# Patient Record
Sex: Male | Born: 1995
Health system: Southern US, Community
[De-identification: ages and names within clinical notes are randomized; demographics above are authoritative.]

---
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
## 2016-07-03 DIAGNOSIS — R197 Diarrhea, unspecified: Secondary | ICD-10-CM | POA: Diagnosis not present

## 2016-07-03 DIAGNOSIS — A084 Viral intestinal infection, unspecified: Secondary | ICD-10-CM | POA: Diagnosis not present

## 2019-05-18 LAB — COMPREHENSIVE METABOLIC PANEL
ALT: 15 U/L (ref 0–41)
AST: 12 U/L (ref 0–40)
Albumin/Globulin Ratio: 1.96 mmol/L (ref 1.00–2.00)
Albumin: 4.9 g/dL (ref 3.5–5.2)
Alk Phosphatase: 86 U/L (ref 40–130)
Anion Gap: 11 mmol/L (ref 2–17)
BUN: 12 mg/dL (ref 6–20)
CALCIUM,CORRECTED,CCA: 8.6 mg/dL (ref 8.6–10.0)
CO2: 29 mmol/L (ref 22–29)
Calcium: 9.3 mg/dL (ref 8.6–10.0)
Chloride: 100 mmol/L (ref 98–107)
Creatinine: 1 mg/dL (ref 0.7–1.3)
GFR African American: 123 mL/min/{1.73_m2} (ref >=90)
GFR Non-African American: 106 mL/min/{1.73_m2} (ref >=90)
Globulin: 2.5 g/dL (ref 1.9–4.4)
Glucose: 99 mg/dL (ref 70–99)
Osmolaliy Calculated: 279 mosm/kg (ref 270–287)
Potassium: 4.3 mmol/L (ref 3.5–5.3)
Sodium: 140 mmol/L (ref 135–145)
Total Bilirubin: 0.8 mg/dL (ref 0.00–1.20)
Total Protein: 7.4 g/dL (ref 6.4–8.3)

## 2019-05-18 LAB — CBC WITH AUTO DIFFERENTIAL
Basophils %: 0.2 % (ref 0.0–2.0)
Basophils Absolute: 0 10*3/uL (ref 0.0–0.2)
Eosinophils %: 0.7 % (ref 0.0–7.0)
Eosinophils Absolute: 0.1 10*3/uL (ref 0.0–0.5)
Hematocrit: 45.2 % (ref 38.0–52.0)
Hemoglobin: 15.3 g/dL (ref 13.0–17.3)
Immature Grans (Abs): 0.05 10*3/uL (ref 0.00–0.06)
Immature Granulocytes %: 0.4 % (ref 0.1–0.6)
Lymphocytes Absolute: 2.7 10*3/uL (ref 1.0–3.2)
Lymphocytes: 22.8 % (ref 15.0–45.0)
MCH: 31.7 pg (ref 27.0–34.5)
MCHC: 33.8 g/dL (ref 32.0–36.0)
MCV: 93.6 fL (ref 84.0–100.0)
MPV: 9.7 fL (ref 7.2–13.2)
Monocytes %: 9.7 % (ref 4.0–12.0)
Monocytes Absolute: 1.2 10*3/uL — ABNORMAL HIGH (ref 0.3–1.0)
NRBC Absolute: 0 10*3/uL (ref 0.000–0.012)
NRBC Automated: 0 % (ref 0.0–0.2)
Neutrophils %: 66.2 % (ref 42.0–74.0)
Neutrophils Absolute: 8 10*3/uL — ABNORMAL HIGH (ref 1.6–7.3)
Platelets: 309 10*3/uL (ref 140–440)
RBC: 4.83 x10e6/mcL (ref 4.00–5.60)
RDW: 12 % (ref 11.0–16.0)
WBC: 12 10*3/uL — ABNORMAL HIGH (ref 3.8–10.6)

## 2019-05-18 LAB — COVID-19, SURVEILLANCE (ASYMPTOMATIC/NO EXPOSURE, OR TEST OF CURE)
Lot/Kit Number: 706290
SARS Cov2 Ag FIA: POSITIVE — AB

## 2019-05-18 LAB — LACTIC ACID
Lactic Acid: 0.9 mmol/L (ref 0.5–2.0)
Lactic Acid: 1.2 mmol/L (ref 0.5–2.0)

## 2019-05-18 NOTE — Case Communication (Signed)
 CM D/C Planning Assessment Ongoing- Text       CM Admission Assessment Entered On:  05/18/2019 18:16 EDT    Performed On:  05/18/2019 18:12 EDT by JACKLINE OLDS               CM Admission Assessment   CM Reason for Care Management Referral :   Discharge planning assessment   CM Insurance Information :   Private Insurance   CM Insurance Co. Name :       [employer] [place] of [place]   CM Prescription Coverage :   Commercial   CM Patient has PCP :   No   Discharge Transporation Needs :   No   CM Assessment Discussion With :   Patient   CM Home/Lay Caregiver Name/Relationship :   Norleen- Father  # [phone]   CM Living Situation :   Home with no services, Family support   CM Patient Admitted From :     Pt is currently a Consulting civil engineer at the [employer]   CM Initial Tentative Discharge Plan :     D/C back to [facility]   CM Alternate Discharge Plan :     TBD   Anticipated Hosp Related Barriers to DC :   None identified   CM Home Barriers :   None   CM Progress Note :   05/18/2019  Pt is + ve for COVID.  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  He is independant with ADL's and Mobility. He states he doesn't have a PCP as he  is never ill.  Pt lives in [place] when not at school.  Dr Lenord is admitting this patient to  [address] [facility] in an OBSERVATION Status for an Acute appendicitis.  Ione Sack LMSW     CM Admission Assessment Complete :   Yes   SACK,  IONE - 05/18/2019 18:12 EDT

## 2019-05-18 NOTE — ED Notes (Signed)
ED Patient Education Note     Patient Education Materials Follows:

## 2019-05-18 NOTE — Nursing Note (Signed)
 Adult Patient History Form-Text       Adult Patient History Entered On:  05/18/2019 22:01 EDT    Performed On:  05/18/2019 21:52 EDT by Annice, RN, Mark               General Info   Patient Identified :   Identification band, Verbal   Information Given By :   Self   Pregnancy Status :   N/A   Lazzara, RN, Mark - 05/18/2019 21:52 EDT   Allergies   (As Of: 05/18/2019 22:01:29 EDT)   Allergies (Active)   No Known Medication Allergies  Estimated Onset Date:   Unspecified ; Created ByBETHA Baseman, RN, Hillary K; Reaction Status:   Active ; Category:   Drug ; Substance:   No Known Medication Allergies ; Type:   Allergy ; Updated By:   Baseman RN, Rolland POUR; Source:   Patient ; Reviewed Date:   05/18/2019 21:53 EDT        Medication History   Medication List   (As Of: 05/18/2019 22:01:29 EDT)   No Known Home Medications     KIRCHOFF,  DANIEL D-MD - 05/18/2019 19:40:49      Normal Order    Lactated Ringers Injection solution 1,000 mL  :   Lactated Ringers Injection solution 1,000 mL ; Status:   Ordered ; Ordered As Mnemonic:   Lactated Ringers Injection 1,000 mL ; Simple Display Line:   50 mL/hr, IV ; Ordering Provider:   KIRCHOFF,  DANIEL D-MD; Catalog Code:   Lactated Ringers Injection ; Order Dt/Tm:   05/18/2019 21:16:00 EDT ; Comment:   stop IV fluids once tolerating PO          oxyCODONE 5 mg Tab  :   oxyCODONE 5 mg Tab ; Status:   Ordered ; Ordered As Mnemonic:   oxyCODONE ; Simple Display Line:   5 mg, 1 tabs, Oral, q6hr, PRN: moderate pain (4-7) ; Ordering Provider:   DICKSON SIEVING D-MD; Catalog Code:   oxyCODONE ; Order Dt/Tm:   05/18/2019 21:16:43 EDT ; Comment:    IMMEDIATE RELEASE            acetaminophen 325 mg Tab  :   acetaminophen 325 mg Tab ; Status:   Ordered ; Ordered As Mnemonic:   Tylenol ; Simple Display Line:   650 mg, 2 tabs, Oral, q4hr, PRN: mild pain (1-3) ; Ordering Provider:   DICKSON SIEVING D-MD; Catalog Code:   acetaminophen ; Order Dt/Tm:   05/18/2019 21:16:00 EDT ; Comment:   MAX DAILY DOSE OF  ACETAMINOPHEN = 3000 MG          HYDROmorphone 2 mg/mL Inj Soln 1 mL  :   HYDROmorphone 2 mg/mL Inj Soln 1 mL ; Status:   Ordered ; Ordered As Mnemonic:   Dilaudid range dose ; Simple Display Line:   1 mg, 0.5 mL, IV Push, q3hr, PRN: severe pain (8-10) ; Ordering Provider:   DICKSON SIEVING D-MD; Catalog Code:   HYDROmorphone ; Order Dt/Tm:   05/18/2019 21:16:00 EDT          ketorolac 30 mg/mL Inj Soln 1 mL  :   ketorolac 30 mg/mL Inj Soln 1 mL ; Status:   Ordered ; Ordered As Mnemonic:   Toradol ; Simple Display Line:   30 mg, 1 mL, IV Push, q8hr, PRN: moderate pain (4-7) ; Ordering Provider:   DICKSON SIEVING D-MD; Catalog Code:   ketorolac ;  Order Dt/Tm:   05/18/2019 21:16:00 EDT          ondansetron 2 mg/mL Inj Soln 2 mL  :   ondansetron 2 mg/mL Inj Soln 2 mL ; Status:   Ordered ; Ordered As Mnemonic:   ondansetron ; Simple Display Line:   4 mg, 2 mL, IV Push, q4hr, PRN: nausea/vomiting ; Ordering Provider:   DICKSON SIEVING D-MD; Catalog Code:   ondansetron ; Order Dt/Tm:   05/18/2019 21:16:00 EDT ; Comment:   Use Zofran first if both Zofran and Phenergan ordered          oxyCODONE 5 mg Tab  :   oxyCODONE 5 mg Tab ; Status:   Ordered ; Ordered As Mnemonic:   oxyCODONE ; Simple Display Line:   10 mg, 2 tabs, Oral, q3hr, PRN: severe pain (8-10) ; Ordering Provider:   DICKSON SIEVING D-MD; Catalog Code:   oxyCODONE ; Order Dt/Tm:   05/18/2019 21:16:00 EDT ; Comment:    IMMEDIATE RELEASE            piperacillin-tazo 4.5 g add-vantage + Sodium Chloride 0.9% intravenous solution add-vantage 100 mL...  :   piperacillin-tazo 4.5 g add-vantage + Sodium Chloride 0.9% intravenous solution add-vantage 100 mL... ; Status:   Completed ; Ordered As Mnemonic:   piperacillin-tazobactam + Sodium Chloride 0.9% 100 mL ; Simple Display Line:   4.5 g, 25 mL/hr, IV Piggyback, Once ; Ordering Provider:   KIRCHOFF,  DANIEL D-MD; Catalog Code:   piperacillin-tazobactam ; Order Dt/Tm:   05/18/2019 18:18:40 EDT           piperacillin-tazo 4.5 g/100 ml NS ADM  :   piperacillin-tazo 4.5 g/100 ml NS ADM ; Status:   Completed ; Ordered As Mnemonic:   piperacillin-tazobactam ( Zosyn ) ; Simple Display Line:   4.5 g, 100 mL, 200 mL/hr, IV Piggyback, Once ; Ordering Provider:   MAILE,  CAMERON JAMES-MD; Catalog Code:   piperacillin-tazobactam ; Order Dt/Tm:   05/18/2019 17:52:36 EDT          Sodium Chloride 0.9% intravenous solution Bolus  :   Sodium Chloride 0.9% intravenous solution Bolus ; Status:   Completed ; Ordered As Mnemonic:   Sodium Chloride 0.9% bolus ; Simple Display Line:   2,640 mL, 440 mL/hr, IV, Once ; Ordering Provider:   MAILE,  CAMERON JAMES-MD; Catalog Code:   Sodium Chloride 0.9% ; Order Dt/Tm:   05/18/2019 17:52:37 EDT ; Comment:   Target Dose: Sodium Chloride 0.9% bolus 30 mL/kg  05/18/2019 17:52:38          morphine 4 mg/mL preservative-free Sol 1 mL  :   morphine 4 mg/mL preservative-free Sol 1 mL ; Status:   Completed ; Ordered As Mnemonic:   morphine ; Simple Display Line:   4 mg, 1 mL, IV Push, Once ; Ordering Provider:   LEORA LEGACY M-DO; Catalog Code:   morphine ; Order Dt/Tm:   05/18/2019 15:40:51 EDT          ondansetron 2 mg/mL Inj Soln 2 mL  :   ondansetron 2 mg/mL Inj Soln 2 mL ; Status:   Completed ; Ordered As Mnemonic:   Zofran ; Simple Display Line:   4 mg, 2 mL, IV Push, Once ; Ordering Provider:   LEORA LEGACY M-DO; Catalog Code:   ondansetron ; Order Dt/Tm:   05/18/2019 15:40:50 EDT          Sodium Chloride 0.9% intravenous solution Bolus  :  Sodium Chloride 0.9% intravenous solution Bolus ; Status:   Completed ; Ordered As Mnemonic:   Sodium Chloride 0.9% bolus ; Simple Display Line:   1,000 mL, 2000 mL/hr, IV Piggyback, Once ; Ordering Provider:   LEORA LEGACY M-DO; Catalog Code:   Sodium Chloride 0.9% ; Order Dt/Tm:   05/18/2019 15:40:51 EDT            Prescription/Discharge Order    oxyCODONE  :   oxyCODONE ; Status:   Prescribed ; Ordered As Mnemonic:   oxyCODONE 5 mg oral tablet ;  Simple Display Line:   5 mg, 1 tabs, Oral, q6hr, PRN: severe pain (8-10), 7 tabs, 0 Refill(s) ; Ordering Provider:   DICKSON SIEVING D-MD; Catalog Code:   oxyCODONE ; Order Dt/Tm:   05/18/2019 19:41:06 EDT            Problem History   (As Of: 05/18/2019 22:01:29 EDT)   Diagnoses(Active)    Acute appendicitis  Date:   05/18/2019 ; Diagnosis Type:   Discharge ; Confirmation:   Confirmed ; Clinical Dx:   Acute appendicitis ; Classification:   Medical ; Clinical Service:   Non-Specified ; Code:   ICD-10-CM ; Probability:   0 ; Diagnosis Code:   K35.80      COVID-19  Date:   05/18/2019 ; Diagnosis Type:   Discharge ; Confirmation:   Confirmed ; Clinical Dx:   COVID-19 ; Classification:   Medical ; Clinical Service:   Non-Specified ; Code:   ICD-10-CM ; Probability:   0 ; Diagnosis Code:   U07.1        Procedure History        -    Procedure History   (As Of: 05/18/2019 22:01:29 EDT)     Procedure Dt/Tm:   05/18/2019 19:03:00 EDT ; Location:   [facility] OR ; Provider:   DICKSON SIEVING D-MD; Anesthesia Type:   General ; :   CLORIA LYNWOOD GRANDCHILD; Anesthesia Minutes:   0 ; Procedure Name:   Appendectomy Laparoscopic ; Procedure Minutes:   31 ; Comments:     05/18/2019 19:41 EDT - MIMS, RN, BARNIE ADE  auto-populated from documented surgical case ; Clinical Service:   Surgery ; Last Reviewed Dt/Tm:   05/18/2019 21:59:01 EDT            Immunizations   Last Tetanus :   Unknown   COVID-19 Vaccine Status :   1 Dose received   1 Dose Received Manufacturer :   Paulino ([employer]) vaccine   Lazzara, RN, Mark - 05/18/2019 21:52 EDT   ID Risk Screen Symptoms   Last 14 days COVID-19 ID :   Yes - Detected (positive)   Lazzara, RNOneil - 05/18/2019 21:52 EDT   Bloodless Medicine   Is Blood Transfusion Acceptable to Patient :   Yes   Lazzara, RN, Mark - 05/18/2019 21:52 EDT   Nutrition   MST Does Your Current Diet Include :   None   MST Have You Recently Lost Weight Without Trying? :   No   MST Weight Loss Score :   0    MST Have You  Been Eating Poorly? :   No   MST Appetite Score :   0    MST Score :   0    MST Interpretation :   Not at risk   Annice OBIE Oneil - 05/18/2019 21:52 EDT   Functional   Sensory Deficits :  None   ADLs Prior to Admission :   Independent   Lazzara, RN, Mark - 05/18/2019 21:52 EDT   Social History   Social History   (As Of: 05/18/2019 22:01:29 EDT)     Harm Screen   Feels Unsafe at Home :   No   Last 3 mo, thoughts killing self/others :   Patient denies   Lazzara, RN, Mark - 05/18/2019 21:52 EDT   Advance Directive   Advance Directive :   No   Lazzara, RN, Oneil - 05/18/2019 21:52 EDT   Education   Caregiver/Advocate Language   Patient :   Verbal explanation, Demonstration   Family :   Verbal explanation   Lazzara, RN, Oneil - 05/18/2019 21:52 EDT   Barriers to Learning :   None evident   Teaching Method :   Demonstration, Explanation   Responsible Learner Present for Session :   Yes   Lazzara, RN, Mark - 05/18/2019 21:52 EDT   Preventative Measures Information   Unit/Room Orientation :   Verbalizes understanding   Environmental Safety :   Verbalizes understanding   Hand Washing :   Verbalizes understanding   Infection Prevention :   Verbalizes understanding   DVT Prophylaxis :   Verbalizes understanding   Isolation Precaution :   Verbalizes understanding   Lazzara, RN, Oneil - 05/18/2019 21:52 EDT   DC Needs   CM Living Situation :   Home with no services, Family support   Anticipated Discharge Needs :   None   Annice OBIE Oneil - 05/18/2019 21:52 EDT   Valuables and Belongings   Does Patient Have Valuables and Belongings :   Yes   Lazzara, RN, Mark - 05/18/2019 21:52 EDT   Valuables and Belongings   At Bedside :   Cell phone   Annice, RN, Mark - 05/18/2019 21:52 EDT   Admission Complete   Admission Complete :   Yes   Lazzara, RN, Mark - 05/18/2019 21:52 EDT

## 2019-05-18 NOTE — ED Notes (Signed)
ED Triage Note       ED Secondary Triage Entered On:  05/18/2019 15:58 EDT    Performed On:  05/18/2019 15:57 EDT by Anastasia Fiedler, RN, Irving Burton               General Information   Barriers to Learning :   None evident   ED Home Meds Section :   Document assessment   [facility] ED Fall Risk Section :   Document assessment   ED Advance Directives Section :   Document assessment   ED Palliative Screen :   N/A (prefilled for <23yo)   Anastasia Fiedler RNIrving Burton - 05/18/2019 15:57 EDT   (As Of: 05/18/2019 15:58:18 EDT)   Diagnoses(Active)    Abdominal pain  Date:   05/18/2019 ; Diagnosis Type:   Reason For Visit ; Confirmation:   Complaint of ; Clinical Dx:   Abdominal pain ; Classification:   Medical ; Clinical Service:   Emergency medicine ; Code:   PNED ; Probability:   0 ; Diagnosis Code:   [id number]      COVID-19  Date:   05/18/2019 ; Diagnosis Type:   Discharge ; Confirmation:   Confirmed ; Clinical Dx:   COVID-19 ; Classification:   Medical ; Clinical Service:   Non-Specified ; Code:   ICD-10-CM ; Probability:   0 ; Diagnosis Code:   U07.1      Right lower quadrant pain  Date:   05/18/2019 ; Diagnosis Type:   Discharge ; Confirmation:   Confirmed ; Clinical Dx:   Right lower quadrant pain ; Classification:   Medical ; Clinical Service:   Non-Specified ; Code:   ICD-10-CM ; Probability:   0 ; Diagnosis Code:   R10.31             -    Procedure History   (As Of: 05/18/2019 15:58:18 EDT)     Phoe[facility] Perch Fall Risk Assessment Tool   Hx of falling last 3 months ED Fall :   No   Patient confused or disoriented ED Fall :   No   Patient intoxicated or sedated ED Fall :   No   Patient impaired gait ED Fall :   No   Use a mobility assistance device ED Fall :   No   Patient altered elimination ED Fall :   No   [facility] ED Fall Score :   0    Helms, RNIrving Burton - 05/18/2019 15:57 EDT   ED Advance Directive   Advance Directive :   No   Anastasia Fiedler, RN, Irving Burton - 05/18/2019 15:57 EDT   Med Hx   Medication List   (As Of: 05/18/2019 15:58:18 EDT)    Normal Order    morphine 4 mg/mL preservative-free Sol 1 mL  :   morphine 4 mg/mL preservative-free Sol 1 mL ; Status:   Ordered ; Ordered As Mnemonic:   morphine ; Simple Display Line:   4 mg, 1 mL, IV Push, Once ; Ordering Provider:   Lollie Sails M-DO; Catalog Code:   morphine ; Order Dt/Tm:   05/18/2019 15:40:51 EDT          ondansetron 2 mg/mL Inj Soln 2 mL  :   ondansetron 2 mg/mL Inj Soln 2 mL ; Status:   Ordered ; Ordered As Mnemonic:   Zofran ; Simple Display Line:   4 mg, 2 mL, IV Push, Once ; Ordering Provider:   Lollie Sails M-DO; Catalog Code:   ondansetron ;  Order Dt/Tm:   05/18/2019 15:40:50 EDT          Sodium Chloride 0.9% intravenous solution Bolus  :   Sodium Chloride 0.9% intravenous solution Bolus ; Status:   Ordered ; Ordered As Mnemonic:   Sodium Chloride 0.9% bolus ; Simple Display Line:   1,000 mL, 2000 mL/hr, IV Piggyback, Once ; Ordering Provider:   Lollie Sails M-DO; Catalog Code:   Sodium Chloride 0.9% ; Order Dt/Tm:   05/18/2019 15:40:51 EDT

## 2019-05-18 NOTE — Op Note (Signed)
Phase II Record - [facility]             Phase II Record - [facility] Summary                                                                  Primary Physician:        Ramond Dial D-MD    Case Number:              [id number]    Finalized Date/Time:      05/18/19 20:44:43    Pt. Name:                 Drew Lowe, Drew Lowe    D.O.B./Sex:               07-19-1995    Male    Med Rec #:                [id number]    Physician:                Ramond Dial D-MD    Financial #:              [id number]    Pt. Type:                 O    Room/Bed:                 /    Admit/Disch:              05/18/19 13:59:00 -    Institution:       [facility] Case Attendance - Phase II                                                                                           Entry 1                         Entry 2                                                                          Case Attendee             KIRCHOFF,  DANIEL D-MD          Vinnie Level, RN, RACHEL V    Role Performed            Surgeon Primary                 Post Anesthesia Care  Nurse    Time In     Time Out     Last Modified By:         Vinnie Level RN, Margo Aye, RN, RACHEL                              V 05/18/19 20:16:55             V 05/18/19 20:30:53      [facility] Case Attendance - Phase II Audit                                                            05/18/19 20:30:53         Owner: Junie Bame                               Modifier: [email or web address]                                                        <+> 2         Case Attendee        <+> 2         Role Performed        [facility] - Case Times - Phase II                                                                                              Entry 1                                                                                                          Phase II In               05/18/19 20:30:00               Phase II Out                    05/18/19 20:35:00    Phase  II Discharge        05/18/19 20:35:00    Time     Last Modified By:         Tanna Furry RN, Alma Friendly  05/18/19 20:44:41              Finalized By: Laurence Ferrari D      Document Signatures                                                                             Signed By:           Laurence Ferrari D 05/18/19 20:44

## 2019-05-18 NOTE — Discharge Summary (Signed)
 ED Clinical Summary                        [facility]  [address]  [place], [place] [address]  [phone]           PERSON INFORMATION  Name: Drew Lowe, Drew Lowe Age:  24 Years DOB: Apr 01, 1995   Sex: Male Language: English PCP: PCP,  NONE   Marital Status: Single Phone: [phone] Med Service: MED-Medicine   MRN: [id number] Acct# [id number] Arrival: 05/18/2019 13:59:00   Visit Reason: Abdominal pain; COVID+/RT LOWER QUAT PAIN/NAUSEA Acuity: 3 LOS: 000 04:49   Address:    [address] [place] [address]   Diagnosis:    Acute appendicitis; COVID-19; Right lower quadrant pain  Medications:    Medications Administered During Visit:                Medication Dose Route   ondansetron 4 mg IV Push   morphine 4 mg IV Push   Sodium Chloride 0.9% 1000 mL IV Piggyback   piperacillin-tazobactam 4.5 g IV Piggyback   Sodium Chloride 0.9% 2640 mL IV               Allergies      No Known Medication Allergies      Major Tests and Procedures:  The following procedures and tests were performed during your ED visit.  COMMON PROCEDURES%>  COMMON PROCEDURES COMMENTS%>                PROVIDER INFORMATION               Provider Role Assigned Sampson Litter, RN, [facility] ED Nurse 05/18/2019 15:30:34    LEORA LEGACY M-DO ED Provider 05/18/2019 15:36:05    CHERL SMALLS JAMES-MD ED Provider 05/18/2019 15:58:33        Attending Physician:  DICKSON SIEVING D-MD      Admit Doc  DICKSON SIEVING D-MD     Consulting Doc  DICKSON SIEVING D-MD     VITALS INFORMATION  Vital Sign Triage Latest   Temp Oral ORAL_1%> ORAL%>   Temp Temporal TEMPORAL_1%> TEMPORAL%>   Temp Intravascular INTRAVASCULAR_1%> INTRAVASCULAR%>   Temp Axillary AXILLARY_1%> AXILLARY%>   Temp Rectal RECTAL_1%> RECTAL%>   02 Sat 100 % 100 %   Respiratory Rate RATE_1%> RATE%>   Peripheral Pulse Rate PULSE RATE_1%>79 bpm PULSE RATE%>   Apical Heart Rate HEART RATE_1%> HEART RATE%>   Blood Pressure BLOOD PRESSURE_1%>/ BLOOD PRESSURE_1%>76 mmHg BLOOD PRESSURE%> / BLOOD  PRESSURE%>74 mmHg                 Immunizations      No Immunizations Documented This Visit          DISCHARGE INFORMATION   Discharge Disposition: S Outpt-Admitted As Inpatient   Discharge Location:  [facility]   Discharge Date and Time:     ED Checkout Date and Time:  05/18/2019 18:48:47     DEPART REASON INCOMPLETE INFORMATION             Problems      No Problems Documented              Smoking Status      No Smoking Status Documented         PATIENT EDUCATION INFORMATION  Instructions:          Follow up:            ED PROVIDER  DOCUMENTATION     Patient:   Drew Lowe, Drew Lowe             MRN: [id number]            FIN: [id number]               Age:   24 years     Sex:  Male     DOB:  [birth year]   Associated Diagnoses:   Right lower quadrant pain; COVID-19; Acute appendicitis   Author:   LEORA LEGACY M-DO      Basic Information   Time seen: Provider Seen (ST)   ED Provider/Time:    LEORA LEGACY M-DO / 05/18/2019 15:36  .   Additional information: Chief Complaint from Nursing Triage Note   Chief Complaint  Chief Complaint: pt arrived wtih c/o RLQ abd pain since yesterday. pt was sent for possible appendicitis. pt is covid positive from 4/9. (05/18/19 14:01:00).      History of Present Illness   24 year old male presented with progressively worsening 4-10 right lower quadrant abdominal pain mild pain in the left lower quadrant more on the right lower quadrant, worse with palpation, some nausea no vomiting, he last ate 3 hours ago.  Denies any dysuria urgency or frequency.  Denies any flank pain.  Denies any fevers or chills.  Incidentally 5 days ago he was diagnosed with Covid because he had a close contact he otherwise has not had any respiratory symptoms.  He was seen at his school somewhere and advised to come to the emergency department because he could have appendicitis.  Denies any testicular pain.        Review of Systems             Additional review of systems information: All other systems reviewed and  otherwise negative.      Health Status   Allergies:    Allergic Reactions (All)  No Known Medication Allergies.   Medications:  (Selected)   Inpatient Medications  Ordered  Sodium Chloride 0.9% bolus: 1,000 mL, 2000 mL/hr, IV Piggyback, Once  Zofran: 4 mg, 2 mL, IV Push, Once  morphine: 4 mg, 1 mL, IV Push, Once.      Past Medical/ Family/ Social History   Medical history: Reviewed as documented in chart.   Surgical history: Reviewed as documented in chart.   Family history: Not significant.   Social history: Reviewed as documented in chart.   Problem list:    No qualifying data available  .      Physical Examination               Vital Signs   Vital Signs   05/18/2019 15:36 EDT Systolic Blood Pressure 142 mmHg  HI     Diastolic Blood Pressure 95 mmHg  HI     Heart Rate Monitored 90 bpm     Respiratory Rate 16 br/min     SpO2 100 %    05/18/2019 14:01 EDT Systolic Blood Pressure 138 mmHg     Diastolic Blood Pressure 76 mmHg (Modified)    Temperature Oral 37.0 degC (Modified)    Heart Rate Monitored 84 bpm (Modified)    Respiratory Rate 16 br/min     SpO2 100 %    .   General:  Alert, no acute distress.    Skin:  Warm, dry.    Head:  Normocephalic.   Neck:  Supple, trachea midline, no JVD.    Eye:  Pupils are equal,  round and reactive to light, extraocular movements are intact.    Cardiovascular:  Regular rate and rhythm, No murmur.    Respiratory:  Lungs are clear to auscultation, respirations are non-labored.    Chest wall:  No deformity.   Back:  Nontender, Normal range of motion.    Musculoskeletal:  Normal ROM, normal strength.    Gastrointestinal:  Soft, Non distended, Normal bowel sounds, No pulsatile midline mass., Tenderness: Moderate, right lower quadrant, Guarding: Minimal.    Genitourinary:  GU exam is normal, performed with nurse Damien and tech in room.   Neurological:  Alert and oriented to person, place, time, and situation, No focal neurological deficit observed, normal speech observed, Gait is steady..     Lymphatics   Psychiatric:  Cooperative, appropriate mood & affect.       Medical Decision Making   Results review:  Lab results : Lab View   05/18/2019 15:47 EDT Lactic Acid Lvl 0.9 mmol/L    SARS Cov2 Ag FIA Positive   05/18/2019 14:08 EDT Estimated Creatinine Clearance 129.65 mL/min   05/18/2019 14:03 EDT Estimated Creatinine Clearance 137.74 mL/min   05/18/2019 12:51 EDT WBC 12.0 x10e3/mcL  HI    RBC 4.83 x10e6/mcL    Hgb 15.3 g/dL    HCT 54.7 %    MCV 06.3 fL    MCH 31.7 pg    MCHC 33.8 g/dL    RDW 87.9 %    Platelet 309 x10e3/mcL    MPV 9.7 fL    Neutro Auto 66.2 %    Neutro Absolute 8.0 x10e3/mcL  HI    Immature Grans Percent 0.4 %    Immature Grans Absolute 0.05 x10e3/mcL    Lymph Auto 22.8 %    Lymph Absolute 2.7 x10e3/mcL    Mono Auto 9.7 %    Mono Absolute 1.2 x10e3/mcL  HI    Eosinophil Percent 0.7 %    Eos Absolute 0.1 x10e3/mcL    Basophil Auto 0.2 %    Baso Absolute 0.0 x10e3/mcL    NRBC Absolute Auto 0.000 x10e3/mcL    NRBC Percent Auto 0.0 %    Sodium Lvl 140 mmol/L    Potassium Lvl 4.3 mmol/L    Chloride 100 mmol/L    CO2 29 mmol/L    Glucose Random 99 mg/dL    BUN 12 mg/dL    Creatinine Lvl 1.0 mg/dL    AGAP 11 mmol/L    Osmolality Calc 279 mOsm/kg    Calcium Lvl 9.3 mg/dL    Calcium (Corrected) Lvl 8.6 mg/dL    Protein Total 7.4 g/dL    Albumin Lvl 4.9 g/dL    Globulin Calc 2.5 g/dL    AG Ratio Calc 8.03 mmol/L    Alk Phos 86 unit/L    AST 12 unit/L    ALT 15 unit/L    eGFR AA 123 mL/min/1.47m    eGFR Non-AA 106 mL/min/1.73m    Bili Total 0.80 mg/dL     .   Radiology results:  Rad Results (ST)   CT Abdomen Pelvis w/o Contrast  ?  05/18/19 17:11:55  CT abdomen pelvis without contrast: 05/18/19    INDICATION: RLQ abdominal pain, appendicitis suspected    COMPARISON: None.    TECHNIQUE: Noncontrast CT was obtained from the lung bases to the pubic  symphysis. CT scanning was performed using radiation dose reduction techniques  when appropriate, per system protocols.    FINDINGS:    LOWER  THORAX:  Visualized lung bases are clear.  ABDOMEN AND PELVIS:    LIVER: No focal lesion.    BILIARY SYSTEM: No cholelithiasis or cholecystitis. No ductal dilatation.    SPLEEN: No splenomegaly.    PANCREAS: No main duct dilatation.    ADRENALS: No nodule.    URINARY SYSTEM: No stones. No hydronephrosis or hydroureter.    STOMACH/BOWEL: No bowel obstruction. No pneumoperitoneum. There is borderline  dilation of the appendix which measures 8 mm in diameter best seen on image  3-55. There is a segment of descending colon measuring approximately 9 cm in  length which is partially decompressed with mild thickening best seen on image  4-56. Radiodense material within the colon, likely ingested contents.    ABDOMINOPELVIC WALL/PERITONEUM: No ascites.    VASCULAR: Aorta is normal in caliber.    PELVIC ORGANS: Unremarkable.    LYMPH NODES: Prominent mesenteric lymph nodes within the right lower quadrant  which are not pathologically enlarged are likely reactive in nature.    BONES/SOFT TISSUES: No suspicious osseous lesions.    IMPRESSION:  Borderline dilation of the appendix which measures up to 8 mm in diameter which  can be seen in early appendicitis. No evidence of abscess formation or  perforation. Recommend general surgery consultation.    There is a 9 cm segment of descending colon which is decompressed with mild wall  thickening. This thickening may be secondary to decompression but can also be  seen in colitis.  ?  Signed By: LONZELL ROGUE C-MD    .   Notes:  Patient reexamined by me at 540.  He is indeed tender in the right lower quadrant, positive rebound.  This in conjunction with his CT is consistent with acute appendicitis.  He is also Covid positive.  I did speak with Dr. Marry at 550 who will admit this patient for further evaluation management.      Impression and Plan   Diagnosis   Acute appendicitis (ICD10-CM K35.80, Discharge, Medical)   Right lower quadrant pain (ICD10-CM R10.31, Discharge,  Medical)   COVID-19 (ICD10-CM U07.1, Discharge, Medical)   Plan   Disposition: Admit time  05/18/2019 17:54:00, Admit to Surgery, Patient care transitioned to: Time: 05/18/2019 16:00:00, MAILE,  CAMERON JAMES-MD, follow up ct, labs, dispo.

## 2019-05-18 NOTE — ED Notes (Signed)
 ED Patient Summary              [facility] Emergency Department  [address] [place], [place] [address]  [phone]  Discharge Instructions (Patient)  _______________________________________     Name: Drew Lowe, Drew Lowe  DOB:  1995/08/27                   MRN: [id number]                   FIN: [id number]  Reason For Visit: Abdominal pain; COVID+/RT LOWER QUAT PAIN/NAUSEA  Final Diagnosis: Acute appendicitis; COVID-19; Right lower quadrant pain     Visit Date: 05/18/2019 13:59:00  Address: [address] [place] [address]  Phone: [phone]     Emergency Department Providers:         Primary Physician:     MAILE, CAMERON [facility] would like to thank you for allowing us  to assist you with your healthcare needs. The following includes patient education materials and information regarding your injury/illness.     Follow-up Instructions:  You were seen today on an emergency basis. Please contact your primary care doctor for a follow up appointment. If you received a referral to a specialist doctor, it is important you follow-up as instructed.    It is important that you call your follow-up doctor to schedule and confirm the location of your next appointment. Your doctor may practice at multiple locations. The office location of your follow-up appointment may be different to the one written on your discharge instructions.    If you do not have a primary care doctor, please call [phone] for help in finding a [facility] Provider. For help in finding a specialist doctor, please call [phone].    The [facility] "Ask a Nurse" line in staffed by Registered Nurses and is a free service to the community. We are available Monday - Friday from 8am to 5pm to answer your questions about your health. Please call [phone].    If your condition gets worse before your follow-up with your primary care doctor or specialist, please return to the Emergency  Department.      Coronavirus 2019 (COVID-19) Reminders:     Patients aged 72 and older, people with increased risk for severe COVID-19 disease, or frontline workers with increased occupational risk can make an appointment for a COVID-19 vaccine. Patients can contact their [facility] doctors' offices to schedule an appointment to receive the COVID-19 vaccine at the [facility] or send us  an email at [email or web address]. Patients who do not have a [facility] physician can call [phone] to schedule vaccination appointments.            Scan this code with your phone camera to send an email to the address above.          Follow Up Appointments:  Primary Care Provider:      Name: PCP,  NONE      Phone:           Printed Prescriptions:    Patient Education Materials:  Discharge Orders       Comment:             Allergy Info: No Known Medication Allergies     Medication Information:  [facility] ED Physicians provided you with a complete list of medications post discharge, if  you have been instructed to stop taking a medication please ensure you also follow up with this information to your Primary Care Physician.  Unless otherwise noted, patient will continue to take medications as prescribed prior to the Emergency Room visit.  Any specific questions regarding your chronic medications and dosages should be discussed with your physician(s) and pharmacist.          No Medications Documented      Medications Administered During Visit:              Medication Dose Route   ondansetron 4 mg IV Push   morphine 4 mg IV Push   Sodium Chloride 0.9% 1000 mL IV Piggyback   piperacillin-tazobactam 4.5 g IV Piggyback   Sodium Chloride 0.9% 2640 mL IV          Major Tests and Procedures:  The following procedures and tests were performed during your ED visit.  COMMON PROCEDURES%>  COMMON PROCEDURES COMMENTS%>          Laboratory Orders  Name Status Details   C Blood Ordered Blood,  Stat, ST - Stat, 05/18/19 15:40:00 EDT, Once, 05/18/19 15:40:00 EDT, Nurse collect, 1 of 2   C Blood Ordered Blood, Stat, ST - Stat, 05/18/19 15:40:00 EDT, Once, 05/18/19 15:40:00 EDT, Nurse collect, 2 of 2   COV FIA Completed Nasal Swab, Stat, ST - Stat, 05/18/19 15:40:00 EDT, 05/18/19 15:40:00 EDT, Nurse collect, LEORA LEGACY M-DO, Print label Y/N   Lactic Completed Blood, Stat, ST - Stat, 05/18/19 15:40:00 EDT, 05/18/19 15:40:00 EDT, Nurse collect, LEORA LEGACY M-DO, Print label Y/N   Lactic Completed Blood, Stat, ST - Stat, 05/18/19 17:52:00 EDT, 05/18/19 17:52:00 EDT, Nurse collect, CHERL OLE FLEMING, Print label Y/N               Radiology Orders  Name Status Details   CT Abdomen Pelvis w/o Contrast Completed 05/18/19 16:22:00 EDT, STAT 1 hour or less, Reason: RLQ abdominal pain, appendicitis suspected, Transport Mode: STRETCHER, pp_set_radiology_subspecialty               Patient Care Orders  Name Status Details   Bed Request Communication Ordered 05/18/19 17:51:00 EDT Covid med surg, Appendicitis, Covid   COVID-19 Status Ordered 05/18/19 18:49:16 EDT, NOT VALID FOR pharmacy, laboratory, radiology., 05/18/19 18:49:16 EDT, COVID-19 Detected   Change attending to Ordered 05/18/19 17:51:00 EDT, DICKSON SIEVING D-MD   ED Assessment Adult Completed 05/18/19 14:03:28 EDT, 05/18/19 14:03:28 EDT   ED Secondary Triage Completed 05/18/19 14:03:28 EDT, 05/18/19 14:03:28 EDT   ED Triage Adult Completed 05/18/19 13:59:38 EDT, 05/18/19 13:59:38 EDT   Notify Provider Completed 05/18/19 14:11:53 EDT, This message can only be seen by Nursing, it is not visible to Pharmacy, Laboratory, or Radiology., 05/18/19 14:11:53 EDT   Patient Isolation Ordered 05/18/19 15:40:00 EDT, Contact and Airborne, Constant Indicator   Saline Lock Insert Completed 05/18/19 15:40:00 EDT, Once, 05/18/19 15:40:00 EDT        ---------------------------------------------------------------------------------------------------------------------  [facility]) encourages you to self-enroll in the [facility] Patient Portal.  [facility] Patient Portal will allow you to manage your personal health information securely from your own electronic device now and in the future.  To begin your Patient Portal enrollment process, please visit [email or web address] Click on "Sign up now" under [facility].  If you find that you need additional assistance on the [facility] Patient Portal or need a copy of your medical records, please call the [facility] Medical Records  Office at [phone].  Comment:

## 2019-05-18 NOTE — ED Provider Notes (Signed)
Abdominal Pain *ED        Patient:   Drew Lowe, Drew Lowe             MRN: [id number]            FIN: [id number]               Age:   24 years     Sex:  Male     DOB:  [birth year]   Associated Diagnoses:   Right lower quadrant pain; COVID-19; Acute appendicitis   Author:   Sonda Rumble M-DO      Basic Information   Time seen: Provider Seen (ST)   ED Provider/Time:    Sonda Rumble M-DO / 05/18/2019 15:36  .   Additional information: Chief Complaint from Nursing Triage Note   Chief Complaint  Chief Complaint: pt arrived wtih c/o RLQ abd pain since yesterday. pt was sent for possible appendicitis. pt is covid positive from 4/9. (05/18/19 14:01:00).      History of Present Illness   24 year old male presented with progressively worsening 4-10 right lower quadrant abdominal pain mild pain in the left lower quadrant more on the right lower quadrant, worse with palpation, some nausea no vomiting, he last ate 3 hours ago.  Denies any dysuria urgency or frequency.  Denies any flank pain.  Denies any fevers or chills.  Incidentally 5 days ago he was diagnosed with Covid because he had a close contact he otherwise has not had any respiratory symptoms.  He was seen at his school somewhere and advised to come to the emergency department because he could have appendicitis.  Denies any testicular pain.        Review of Systems             Additional review of systems information: All other systems reviewed and otherwise negative.      Health Status   Allergies:    Allergic Reactions (All)  No Known Medication Allergies.   Medications:  (Selected)   Inpatient Medications  Ordered  Sodium Chloride 0.9% bolus: 1,000 mL, 2000 mL/hr, IV Piggyback, Once  Zofran: 4 mg, 2 mL, IV Push, Once  morphine: 4 mg, 1 mL, IV Push, Once.      Past Medical/ Family/ Social History   Medical history: Reviewed as documented in chart.   Surgical history: Reviewed as documented in chart.   Family history: Not significant.   Social history: Reviewed as  documented in chart.   Problem list:    No qualifying data available  .      Physical Examination               Vital Signs   Vital Signs   3/87/5643 32:95 EDT Systolic Blood Pressure 188 mmHg  HI     Diastolic Blood Pressure 95 mmHg  HI     Heart Rate Monitored 90 bpm     Respiratory Rate 16 br/min     SpO2 100 %    05/19/6061 01:60 EDT Systolic Blood Pressure 109 mmHg     Diastolic Blood Pressure 76 mmHg (Modified)    Temperature Oral 37.0 degC (Modified)    Heart Rate Monitored 84 bpm (Modified)    Respiratory Rate 16 br/min     SpO2 100 %    .   General:  Alert, no acute distress.    Skin:  Warm, dry.    Head:  Normocephalic.   Neck:  Supple, trachea midline, no JVD.  Eye:  Pupils are equal, round and reactive to light, extraocular movements are intact.    Cardiovascular:  Regular rate and rhythm, No murmur.    Respiratory:  Lungs are clear to auscultation, respirations are non-labored.    Chest wall:  No deformity.   Back:  Nontender, Normal range of motion.    Musculoskeletal:  Normal ROM, normal strength.    Gastrointestinal:  Soft, Non distended, Normal bowel sounds, No pulsatile midline mass., Tenderness: Moderate, right lower quadrant, Guarding: Minimal.    Genitourinary:  GU exam is normal, performed with nurse Raquel Sarna and tech in room.   Neurological:  Alert and oriented to person, place, time, and situation, No focal neurological deficit observed, normal speech observed, Gait is steady..    Lymphatics   Psychiatric:  Cooperative, appropriate mood & affect.       Medical Decision Making   Results review:  Lab results : Lab View   05/18/2019 15:47 EDT Lactic Acid Lvl 0.9 mmol/L    SARS Cov2 Ag FIA Positive   05/18/2019 14:08 EDT Estimated Creatinine Clearance 129.65 mL/min   05/18/2019 14:03 EDT Estimated Creatinine Clearance 137.74 mL/min   05/18/2019 12:51 EDT WBC 12.0 x10e3/mcL  HI    RBC 4.83 x10e6/mcL    Hgb 15.3 g/dL    HCT 45.2 %    MCV 93.6 fL    MCH 31.7 pg    MCHC 33.8 g/dL    RDW 12.0 %    Platelet  309 x10e3/mcL    MPV 9.7 fL    Neutro Auto 66.2 %    Neutro Absolute 8.0 x10e3/mcL  HI    Immature Grans Percent 0.4 %    Immature Grans Absolute 0.05 x10e3/mcL    Lymph Auto 22.8 %    Lymph Absolute 2.7 x10e3/mcL    Mono Auto 9.7 %    Mono Absolute 1.2 x10e3/mcL  HI    Eosinophil Percent 0.7 %    Eos Absolute 0.1 x10e3/mcL    Basophil Auto 0.2 %    Baso Absolute 0.0 x10e3/mcL    NRBC Absolute Auto 0.000 x10e3/mcL    NRBC Percent Auto 0.0 %    Sodium Lvl 140 mmol/L    Potassium Lvl 4.3 mmol/L    Chloride 100 mmol/L    CO2 29 mmol/L    Glucose Random 99 mg/dL    BUN 12 mg/dL    Creatinine Lvl 1.0 mg/dL    AGAP 11 mmol/L    Osmolality Calc 279 mOsm/kg    Calcium Lvl 9.3 mg/dL    Calcium (Corrected) Lvl 8.6 mg/dL    Protein Total 7.4 g/dL    Albumin Lvl 4.9 g/dL    Globulin Calc 2.5 g/dL    AG Ratio Calc 1.96 mmol/L    Alk Phos 86 unit/L    AST 12 unit/L    ALT 15 unit/L    eGFR AA 123 mL/min/1.28m???    eGFR Non-AA 106 mL/min/1.769m??    Bili Total 0.80 mg/dL     .   Radiology results:  Rad Results (ST)   CT Abdomen Pelvis w/o Contrast  ?  05/18/19 17:11:55  CT abdomen pelvis without contrast: 05/18/19    INDICATION: RLQ abdominal pain, appendicitis suspected    COMPARISON: None.    TECHNIQUE: Noncontrast CT was obtained from the lung bases to the pubic  symphysis. CT scanning was performed using radiation dose reduction techniques  when appropriate, per system protocols.    FINDINGS:    LOWER THORAX:  Visualized  lung bases are clear.    ABDOMEN AND PELVIS:    LIVER: No focal lesion.    BILIARY SYSTEM: No cholelithiasis or cholecystitis. No ductal dilatation.    SPLEEN: No splenomegaly.    PANCREAS: No main duct dilatation.    ADRENALS: No nodule.    URINARY SYSTEM: No stones. No hydronephrosis or hydroureter.    STOMACH/BOWEL: No bowel obstruction. No pneumoperitoneum. There is borderline  dilation of the appendix which measures 8 mm in diameter best seen on image  3-55. There is a segment of descending colon measuring  approximately 9 cm in  length which is partially decompressed with mild thickening best seen on image  4-56. Radiodense material within the colon, likely ingested contents.    ABDOMINOPELVIC WALL/PERITONEUM: No ascites.    VASCULAR: Aorta is normal in caliber.    PELVIC ORGANS: Unremarkable.    LYMPH NODES: Prominent mesenteric lymph nodes within the right lower quadrant  which are not pathologically enlarged are likely reactive in nature.    BONES/SOFT TISSUES: No suspicious osseous lesions.    IMPRESSION:  Borderline dilation of the appendix which measures up to 8 mm in diameter which  can be seen in early appendicitis. No evidence of abscess formation or  perforation. Recommend general surgery consultation.    There is a 9 cm segment of descending colon which is decompressed with mild wall  thickening. This thickening may be secondary to decompression but can also be  seen in colitis.  ?  Signed By: Truman Hayward C-MD    .   Notes:  Patient reexamined by me at 540.  He is indeed tender in the right lower quadrant, positive rebound.  This in conjunction with his CT is consistent with acute appendicitis.  He is also Covid positive.  I did speak with Dr. Enis Gash at 550 who will admit this patient for further evaluation management.      Impression and Plan   Diagnosis   Acute appendicitis (ICD10-CM K35.80, Discharge, Medical)   Right lower quadrant pain (ICD10-CM R10.31, Discharge, Medical)   COVID-19 (ICD10-CM U07.1, Discharge, Medical)   Plan   Disposition: Admit time  05/18/2019 17:54:00, Admit to Surgery, Patient care transitioned to: Time: 05/18/2019 16:00:00, MAILE,  CAMERON JAMES-MD, follow up ct, labs, dispo.    Signature Line     Electronically Signed on 05/18/2019 03:43 PM EDT   ________________________________________________   Sonda Rumble M-DO      Electronically Signed on 05/18/2019 05:54 PM EDT   ________________________________________________   Lanae Boast JAMES-MD            Modified by:  Sonda Rumble M-DO on 05/18/2019 03:43 PM EDT      Modified by: Lanae Boast JAMES-MD on 05/18/2019 05:54 PM EDT

## 2019-05-18 NOTE — Op Note (Signed)
Phase I Record - [facility]             Phase I Record - [facility] Summary                                                                   Primary Physician:        Ramond Dial D-MD    Case Number:              [id number]    Finalized Date/Time:      05/18/19 20:30:39    Pt. Name:                 Drew Lowe, Drew Lowe    D.O.B./Sex:               11-15-1995    Male    Med Rec #:                [id number]    Physician:                Ramond Dial D-MD    Financial #:              [id number]    Pt. Type:                 O    Room/Bed:                 /    Admit/Disch:              05/18/19 13:59:00 -    Institution:       [facility] Case Attendance - Phase I                                                                                            Entry 1                                                                                                          Case Attendee             Vinnie Level, RN, RACHEL V        Role Performed                  Phase II Nurse    Last Modified By:         Vinnie Level, RN, RACHEL  V 05/18/19 20:17:04      [facility] - Case Times - Phase I                                                                                               Entry 1                                                                                                          Phase I In                05/18/19 19:40:00               Phase I Out                     05/18/19 20:30:00    Phase I Discharge         05/18/19 20:30:00    Time     Last Modified By:         Mayer Camel, RN, RACHEL                              V 05/18/19 20:30:37              Finalized By: Mayer Camel RN, RACHEL V      Document Signatures                                                                             Signed By:           Mayer Camel [place], RACHEL V 05/18/19 20:30

## 2019-05-18 NOTE — ED Notes (Signed)
ED Triage Note       ED Triage Adult Entered On:  05/18/2019 14:03 EDT    Performed On:  05/18/2019 14:01 EDT by Caryn Section, RN, Hillary K               Triage   Chief Complaint :   pt arrived wtih c/o RLQ abd pain since yesterday. pt was sent for possible appendicitis. pt is covid positive from 4/9.     Numeric Rating Pain Scale :   4     [place] Mode of Arrival :   Walking   [place], [place], [place] K - 05/18/2019 14:05 EDT     Infectious Disease Documentation :   Document assessment   Caryn Section RN, Hillary K - 05/18/2019 14:01 EDT   Temperature Oral :   37.0 degC(Converted to: 98.6 degF)      Heart Rate Monitored :   84 bpm   Caryn Section RN, [place] K - 05/18/2019 14:05 EDT     Respiratory Rate :   16 br/min   Systolic Blood Pressure :   138 mmHg   Caryn Section, RN, [place] K - 05/18/2019 14:01 EDT   Diastolic Blood Pressure :   76 mmHg   Fox, RN, Hillary K - 05/18/2019 14:05 EDT     SpO2 :   100 %   Oxygen Therapy :   Room air   Patient presentation :   None of the above   Chief Complaint or Presentation suggest infection :   No   Fox, RN, Hillary K - 05/18/2019 14:01 EDT   Weight Dosing :   88 kg(Converted to: 194 lb 0 oz)      Height :   178 cm(Converted to: 5 ft 10 in)      Body Mass Index Dosing :   28 kg/m2   Risa Grill K - 05/18/2019 14:05 EDT   Caryn Section, RN, Hillary K - 05/18/2019 14:05 EDT     DCP GENERIC CODE   Tracking Group :   ED [facility] Main Tracking Group   Tracking Acuity :   3   Huel Coventry - 05/18/2019 14:01 EDT   ED General Section :   Document assessment   Pregnancy Status :   N/A   ED Allergies Section :   Document assessment   ED Reason for Visit Section :   Document assessment   Huel Coventry - 05/18/2019 14:01 EDT   ID Risk Screen Symptoms   Last 14 days COVID-19 ID :   Yes - Detected (positive)         Caryn Section RN, Hillary K - 05/18/2019 14:11 EDT     Allergies   (As Of: 05/18/2019 14:08:38 EDT)   Allergies (Active)   No Known Medication Allergies  Estimated Onset Date:   Unspecified ; Created ByCaryn Section, RN, Hillary K; Reaction  Status:   Active ; Category:   Drug ; Substance:   No Known Medication Allergies ; Type:   Allergy ; Updated By:   Caryn Section RN, Linus Mako; Source:   Patient ; Reviewed Date:   05/18/2019 14:08 EDT        Psycho-Social   Last 3 mo, thoughts killing self/others :   Patient denies   Right click within box for Suspected Abuse policy link. :   None   Feels Unsafe at Home :   No   ED Behavioral Activity Rating Scale :   4 - Quiet and awake (  normal level of activity)   Caryn Section, RN, Linus Mako - 05/18/2019 14:01 EDT   ED Reason for Visit   (As Of: 05/18/2019 14:11:53 EDT)   Diagnoses(Active)    Abdominal pain  Date:   05/18/2019 ; Diagnosis Type:   Reason For Visit ; Confirmation:   Complaint of ; Clinical Dx:   Abdominal pain ; Classification:   Medical ; Clinical Service:   Emergency medicine ; Code:   PNED ; Probability:   0 ; Diagnosis Code:   [id number]

## 2019-05-19 NOTE — Consults (Signed)
Consultation    [place], DO  Service Date: 05/19/2019    INFECTIOUS DISEASE CONSULTATION    REQUESTING PHYSICIAN:  Dr. Juanita Laster.    REASON FOR CONSULTATION:  To evaluate patient with COVID-19.    HISTORY OF PRESENT ILLNESS:  This is the first [facility]  admission for this 24 year old white male Drew Lowe admitted  05/18/2019 via the Emergency Room due to the development of acute  appendicitis.  The patient states he was in his baseline state of  health until April 1st when he received a single dose of the [employer] COVID-19 vaccine.  He denied significant vaccine  associated side effects.  Within a couple of days he was informed that  his roommate was infected with COVID-19.  He was tested on  approximately April 5th and was reportedly negative.  He was placed in  the dorm containing quarantined individuals.  He later developed  symptoms including headaches, low-grade fever and myalgias.  He was  retested for COVID on the 9th of April and test returned positive.  He  used over-the-counter medications for symptom management.  One day  prior to current admission, he developed right lower quadrant  abdominal pain.  He presented to the Emergency Room for evaluation.   CT scan revealed evidence of acute appendicitis.  The patient was  taken to the operating room yesterday by Dr. Mercie Eon.  Laparoscopic  appendectomy was performed.  No evidence of perforation was  identified.  Early this morning, the patient developed hypoxia,  prompting initiation of supplemental oxygen.  Chest x-ray is pending.   Infectious disease evaluation is now requested to further guide  appropriate treatment.    PAST MEDICAL HISTORY:  Denied.    PAST SURGICAL HISTORY:  T and A.    ALLERGIES:  None.    MEDICATIONS:  None.    SOCIAL HISTORY:  The patient resides in [place], [place].   He is a Equities trader at [employer].  No tobacco or heavy alcohol use.    FAMILY HISTORY:   Hypertension.    REVIEW OF SYSTEMS:  Significant for low-grade fever, weakness,  fatigue, malaise, headache, body aches, abdominal pain, shortness of  breath, dry cough.  He denied visual acuity changes, otalgia, sinus  congestion, sore throat, dental pain, swallowing difficulty,  hemoptysis, chest pain, palpitations, vomiting, melena, diarrhea,  hematochezia, dysuria, hematuria, joint swelling, rash, syncope.    LABORATORY STUDIES:  White count 12, hemoglobin 15.3, hematocrit 45.2,  platelet 309.  Sodium 140, potassium 4.3, bicarbonate 29, creatinine  1, AST 12, ALT 15.  Lactic acid 0.9.  COVID antigen test April 14th  positive.    PHYSICAL EXAMINATION:  GENERAL:  This is a young white male,  comfortable on nasal cannula O2, hydration adequate, well-nourished.   VITAL SIGNS:  He is afebrile, heart rate 99, respirs 18, BP 107/64, O2  sat 88% on room air.  SKIN:  Negative rash.  HEENT:  Negative scleral  icterus, negative oral lesions.  NECK:  Supple, negative adenopathy.   CHEST:  Diminished breath sounds at the bases posteriorly, negative  retractions, negative rub.  CARDIAC:  S1, S2 regular.  ABDOMEN:  Soft,  nondistended.  Operative incisions intact with minimal bruising.  Rare  bowel sounds.  EXTREMITIES:  Negative edema, negative joint effusions.   NEUROLOGIC:  Intact.    ASSESSMENT:  COVID-19.  Previously received a single dose of [employer]  vaccine 05/05/2019 without vaccine associated  adverse event.  Exposed  to his roommate who is known to be COVID positive.  Initial test on  the 5th was negative.  Test performed on the 9th was reportedly  positive.  Antigen assay performed in the Emergency Room on the 14th  also positive.  Approximately day 7 of symptoms.  Lung bases were  noted to be clear on initial CT of abdomen.  He has developed oxygen  requirement.  Unclear if this represents COVID associated lung disease  or aspiration event as a byproduct of recent surgery.  Continue  airborne isolation.  Wean O2 as  tolerated.  Decadron 6 mg p.o. daily.   Defer additional therapies, i.e., remdesivir, convalescent sera,  baricitinib and tocilizumab at this time.  Encourage aggressive  pulmonary toilet measures, spirometry, aspiration precautions.   Anticoagulation per protocol.  Monitor inflammatory markers daily.   Encourage proning.  Defer antibacterials until chest x-ray returns.   Begin Unasyn and if it appears the patient may have developed element  of aspiration pneumonia.  Repeat WBC and CRP in a.m.      Drew Morn [place], DO  TR: tn DD: 05/19/2019 11:42 TD: 05/19/2019 11:55 Job#: [id number]  \\[id number]\\DOC#: [id number]  \\[id number]\\  Signature Line    Electronically Signed on 05/23/2019 01:06 PM EDT  ________________________________________________  Danise Mina J-DO

## 2019-05-19 NOTE — Progress Notes (Signed)
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 No distress. Great sense of humor. Declined Pastoral Care services at this time.   ARP,  ROBERT K - 05/19/2019 14:58 EDT

## 2019-05-19 NOTE — Case Communication (Signed)
 CM Discharge Planning Assessment - Text       CM Progress Note Entered On:  05/19/2019 9:59 EDT    Performed On:  05/19/2019 9:58 EDT by TILMON, RN, AMANDA J               CM Progress Note   CM Home/Lay Caregiver Name/Relationship :   Blinda Father  # [phone]   CM Initial Tentative Discharge Plan :     D/C back to [facility]   CM Alternate Discharge Plan :     TBD   CM Progress Note :   05/18/2019  Pt is + ve for COVID.  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  He is independant with ADL's and Mobility. He states he doesn't have a PCP as he  is never ill.  Pt lives in [place] when not at school.  Dr Lenord is admitting this patient to  [facility] in an OBSERVATION Status for an Acute appendicitis.  Ione Sack LMSW    05/19/2019 Pt now pod #1 for lap appy. 4/14 bld cx pending. Pt afebrile, requiring 2L O2NC. CM to follow, no dc needs anticipated att. ariel TILMON, RN, AMANDA J - 05/19/2019 9:58 EDT

## 2019-05-20 LAB — BASIC METABOLIC PANEL
Anion Gap: 8 mmol/L (ref 2–17)
BUN: 12 mg/dL (ref 6–20)
CO2: 28 mmol/L (ref 22–29)
Calcium: 9.1 mg/dL (ref 8.6–10.0)
Chloride: 104 mmol/L (ref 98–107)
Creatinine: 1 mg/dL (ref 0.7–1.3)
GFR African American: 123 mL/min/{1.73_m2} (ref >=90)
GFR Non-African American: 106 mL/min/{1.73_m2} (ref >=90)
Glucose: 91 mg/dL (ref 70–99)
OSMOLALITY CALCULATED: 279 mOsm/kg (ref 270–287)
Potassium: 4.3 mmol/L (ref 3.5–5.3)
Sodium: 140 mmol/L (ref 135–145)

## 2019-05-20 LAB — CBC WITH AUTO DIFFERENTIAL
Absolute Baso #: 0 10*3/uL (ref 0.0–0.2)
Absolute Eos #: 0.2 10*3/uL (ref 0.0–0.5)
Absolute Lymph #: 2.6 10*3/uL (ref 1.0–3.2)
Absolute Mono #: 0.7 10*3/uL (ref 0.3–1.0)
Basophils %: 0.3 % (ref 0.0–2.0)
Eosinophils %: 2 % (ref 0.0–7.0)
Hematocrit: 43.1 % (ref 38.0–52.0)
Hemoglobin: 14.7 g/dL (ref 13.0–17.3)
Immature Grans (Abs): 0.02 10*3/uL (ref 0.00–0.06)
Immature Granulocytes: 0.3 % (ref 0.1–0.6)
Lymphocytes: 34.7 % (ref 15.0–45.0)
MCH: 31.9 pg (ref 27.0–34.5)
MCHC: 34.1 g/dL (ref 32.0–36.0)
MCV: 93.5 fL (ref 84.0–100.0)
MPV: 9 fL (ref 7.2–13.2)
Monocytes: 8.7 % (ref 4.0–12.0)
NRBC Absolute: 0 10*3/uL (ref 0.000–0.012)
NRBC Automated: 0 % (ref 0.0–0.2)
Neutrophils %: 54 % (ref 42.0–74.0)
Neutrophils Absolute: 4 10*3/uL (ref 1.6–7.3)
Platelets: 272 10*3/uL (ref 140–440)
RBC: 4.61 x10e6/mcL (ref 4.00–5.60)
RDW: 12 % (ref 11.0–16.0)
WBC: 7.4 10*3/uL (ref 3.8–10.6)

## 2019-05-20 LAB — C-REACTIVE PROTEIN: CRP: 3.33 mg/dL — ABNORMAL HIGH (ref 0.00–0.50)

## 2019-05-20 NOTE — Procedures (Signed)
IntraOp Record - RH[facility]             IntraOp Record - [facility] Summary                                                                   Primary Physician:        Juanita Laster D-MD    Case Number:              [phone]    Finalized Date/Time:      05/20/19 11:20:27    Pt. Name:                 Drew Lowe, Drew Lowe    D.O.B./Sex:               Jun 04, 1995    Male    Med Rec #:                [id number]    Physician:                Juanita Laster D-MD    Financial #:              [id number]    Pt. Type:                 I    Room/Bed:                 0673/01    Admit/Disch:              05/19/19 17:05:00 -    Institution:       [facility] - Case Times                                                                                                         Entry 1                                                                                                          Patient      In Room Time             05/18/19 18:44:00               Out Room Time                   05/18/19 19:39:00    Anesthesia     Procedure  Start Time               05/18/19 19:03:00               Stop Time                       05/18/19 19:34:00    Last Modified By:         Elza Rafter                              05/20/19 11:20:24      [facility] - Case Times Audit                                                                          05/20/19 11:20:24         Owner: MIMSDE                               Modifier: [email or web address]                                                            1     <*> Out Room Time                          05/18/19 19:41:00     05/18/19 19:41:07         Owner: MIMSDE                               Modifier: [email or web address]                                                        <+> 1         Out Room Time        <+> 1         Stop Time     05/18/19 19:04:56         Owner: MIMSDE                               Modifier: MIMSDE                                                        <+> 1         Start Time        [facility] - Case Attendance  Entry 1                         Entry 2                         Entry 3                                          Case Attendee             KIRCHOFF,  DANIEL D-MD          REED,  Kathaleen Maser          MIMS, RN, [place]    Role Performed            Surgeon Primary                 Anesthesiologist                Circulator    Time In     Time Out     Procedure                 Appendectomy                    Appendectomy                    Appendectomy                              Laparoscopic                    Laparoscopic                    Laparoscopic    Last Modified By:         MIMS, RN, [place], RN, [place], RN, [facility]                              [facility] 05/18/19 18:38:19        KOHLHE 05/18/19 18:40:45        [place] 05/18/19 18:40:45                                Entry 4                                                                                                          Case Attendee             Robyn Haber    Role Performed  Surgical Scrub    Time In     Time Out     Procedure                 Appendectomy                              Laparoscopic    Last Modified By:         MIMS, RN, [facility]                              [facility] 05/18/19 18:40:45      RH[facility] - Case Attendance Audit                                                                     05/18/19 18:40:45         Owner: MIMSDE                               Modifier: MIMSDE                                                        <+> 2         Case Attendee        <+> 2         Role Performed        <+> 2         Procedure        <+> 3         Case Attendee        <+> 3         Role Performed        <+> 3         Procedure        <+> 4         Case Attendee        <+> 4         Role Performed        <+> 4         Procedure        [facility] - Skin Assessment                                                                           Pre-Care Text:            A.240 Assesses baseline skin condition Im.120 Implements protective measures to prevent skin or tissue injury           due to mechanical sources  Im.280.1 Implements progective measures to prevent skin or tissue injury due to  thermal sources Im.360 Monitors for signs and symptons of infection                              Entry 1                                                                                                          Skin Integrity            Intact    Last Modified By:         MIMS, RN, [facility]                              [facility] 05/18/19 19:05:19    Post-Care Text:            E.10 Evaluates for signs and symptoms of physical injury to skin and tissue E.270 Evaluate tissue perfusion           O.60 Patient is free from signs and symptoms of injury caused by extraneous objects   O.210 Patinet's tissue           perfusion is consistent with or improved from baseline levels      RH[facility] - Patient Positioning                                                                      Pre-Care Text:            A.240 Assesses baseline skin condition A.280 Identifies baseline musculoskeletal status A.280.1 Identifies           physical alterations that require additional precautions for procedure-specific positioning A.510.8 Maintains           patient's dignity and privacy Im.120 Implements protective measures to prevent skin/tissue injury due to           mechanical sources Im.40 Positions the patient Im.80 Applies safety devices                              Entry 1                                                                                                          Procedure  Appendectomy                    Body Position                   Supine                              Laparoscopic    Left Arm Position         Tucked and Padded at            Right Arm Position              Extended on Padded Arm                              Side                                                             Board w/Security Strap    Left Leg Position         Extended Security               Right Leg Position              Extended Security                              Strap, Pillow Under Knee                                        Strap, Pillow Under Knee    Feet Uncrossed            Yes                             Pressure Points                 Yes                                                              Checked     Positioning Device        Gel Pad Full Body, Head         Positioned By                   Juanita Laster D-MD,                              Rest Foam                                                       REED,  Kathaleen Maser,                                                                                              MIMS, RN, [facility]    Outcome Met (O.80)        Yes    Last Modified By:         MIMS, RN, [facility]                              [facility] 05/18/19 19:03:30    Post-Care Text:            A.240 Assesses baseline skin condition A.280 Identifies baseline musculoskeletal status A.280.1 Identifies           physical alterations that require additional precautions for procedure-specific positioning A.510.8 Maintains           patient's dignity and privacy Im.120 Implements protective measures to prevent skin/tissue injury due to           mechanical sources Im.40 Positions the patient Im.80 Applies safety devices      RH[facility] - Skin Prep                                                                                Pre-Care Text:            A.30 Verifies allergies A.20 Verifies procedure, surgical site, and laterality A.510.8 Maintains paritnet's           dignity and privacy Im.270 Performs Skin Preparation Im.270.1 Implements protective measures to prevent skin           and tissue injury due to chemical sources  A.300.1 Protects from cross-contamination                              Entry 1                                                                                                           Hair Removal     Skin Prep      Prep Agents (Im.270)     Chlorhexidine Gluconate         Prep Area (Im.270)              Abdomen  2% w/Alcohol     Prep By                  Juanita Laster D-MD    Outcome Met (O.100)       Yes    Last Modified By:         MIMS, RN, [facility]                              [facility] 05/18/19 19:04:33    Post-Care Text:            E.10 Evaluates for signs and symptoms of physical injury to skin and tissue O.100 Patient is free from signs           and symptoms of chemical injury  O.740 The patient's right to privacy is maintained      RH[facility] - Counts Initial and Final                                                                 Pre-Care Text:            A.20.2 - Assesses the risk for unintended retained surgical items Im.20 - Performs required counts                              Entry 1                                                                                                          Initial Counts      Initial Counts           MIMS, RN, DEBORAH               Items included in               Instruments, Sponges,     Performed By             Alferd Apa         the Initial Count               Sharps                              L    Final Counts      Final Counts             Barrett,  Priscille Heidelberg,              Final Count Status              Correct     Performed By             MIMS, RN,  DEBORAH KOHLHE     Items Included in        Sponges, Sharps     Final Count     Outcome Met (O.20)        Yes    Last Modified By:         MIMS, RN, [facility]                              [facility] 05/18/19 19:00:35    Post-Care Text:            E.50 - Evaluates results of the surgical count O.20 - Patient is free from unintended retained surgical items      RH[facility] - General Case Data                                                                        Pre-Care Text:            A.350.1 Classifies surgical wound                               Entry 1                                                                                                          Case Information      ASA Class                2E                              Case Level                      Level 3     OR                       RH7 09                          Specialty                       General (SN)     Wound Class              2-Clean-Contaminated    Preop Diagnosis           APPENDICITIS                    Postop Diagnosis                APPENDICITIS    Last Modified By:  MIMS, RN, [facility]                              [facility] 05/18/19 18:58:21    Post-Care Text:            O.760 Patient receives consistent and comparable care regardless of the setting      RHOR - General Case Data Audit                                                                   05/18/19 18:58:21         Owner: MIMSDE                               Modifier: MIMSDE                                                            1     <*> ASA Class                              1E        RHOR - Fire Risk Assessment                                                                                               Entry 1                                                                                                          Fire Risk                 Alcohol Based Prep              Fire Risk Score                 2    Assessment: If            Solution, Ignition    checked, checkmark        Source In Use    = 1 point     Last Modified By:  MIMS, RN, [place] 05/18/19 18:41:11      RHOR - Time Out                                                                                 Pre-Care Text:            A.10 Confirms patient identity A.20 Verifies operative procedure, surgical site, and laterality A.20.1 Verifies           consent for planned procedure A.30 Verifies allergies                              Entry 1                                                                                                           Procedure                 Appendectomy                    Is everyone ready               Yes                              Laparoscopic                    to perform time out     Have all members of       Yes                             Patient name and                Yes    the surgical team                                         DOB confirmed     been introduced     Allergies discussed       Yes                             Surgical procedure              Yes  to be performed                                                               confirmed and                                                               verified by                                                               completed surgical                                                               consent     Correct surgical          Yes                             Correct laterality              Yes    site marked and                                           confirmed     initials are     visible through     prepped and draped     field (or     alternative ID band     used)     Correct patient           Yes                             Surgeon shares                  Yes    position confirmed                                        operative plan,                                                               possible  difficulties,                                                               expected duration,                                                               anticipated blood                                                               loss and reviews                                                               all                                                               critical/specific                                                                concerns     Required blood            Yes                             Essential imaging               Yes    products, implants,                                       available and fetal     devices and/or                                            heartones confirmed     special equipment                                         (if applicable)     available and  sterility confirmed     VTE prophylaxis           Yes                             Antibiotics ordered             Yes    addressed                                                 and administered     Anesthesia shares         Yes                             Fire risk                       Yes    anesthetic plan and                                       assessment scored     reviews patient                                           and plan discussed     specific concerns     Appropriate drying        Yes                             Surgeon states:                 Yes    time for prep                                             Does anyone have     observed before                                           any concerns? If     draping                                                   you see, suspect,                                                               or feel that  patient care is                                                               being compromised,                                                               speak up for                                                               patient safety     Time Out Complete         05/18/19 19:03:00    Last Modified By:         MIMS, RN, [facility]                              [facility] 05/18/19 19:05:03    Post-Care Text:            E.30 Evaluates verification process for correct patient, site, side, and level surgery      RH[facility] - Time Out Audit                                                                             05/18/19 19:05:03         Owner: MIMSDE                               Modifier: MIMSDE                                                            1     <*> Procedure                              Appendectomy Laparoscopic            1     <+> Time Out Complete        RHOR - Debrief  Pre-Care Text:            Im.330 Manages specimen handling and disposition                              Entry 1                                                                                                          Procedure                 Appendectomy                    Final counts                    Yes                              Laparoscopic                    correct and                                                               verbally verified                                                               with                                                               surgeon/licensed                                                               independent                                                               practitioner (if  applicable)     Actual procedure          Yes                             Postop diagnosis                Yes    performed confirmed                                       confirmed     Wound                     Yes                             Confirm specimens               Yes    classification                                            and specimens     confirmed                                                 labeled                                                               appropriately (if                                                               applicable)     Foley catheter            Yes                             Patient recovery                Yes    removed (if                                               plan confirmed      applicable)     Debrief Complete          05/18/19 19:24:00    Last Modified By:         MIMS, RN, [facility]                              [facility] 05/18/19 19:24:25  Post-Care Text:            E.800 Ensures continuity of care E.50 Evaluates results of the surgical count O.30 Patient's procedure is           performed on the correct site, side, and level O.50 patient's current status is communicated throughout the           continuum of care O.40 Patient's specimen(s) is managed in the appropriate manner      RH[facility] - Debrief Audit                                                                             05/18/19 19:24:25         Owner: MIMSDE                               Modifier: MIMSDE                                                            1     <*> Procedure                              Appendectomy Laparoscopic            1     <+> Debrief Complete        RHOR - [place]                                                                                  Pre-Care Text:            A.240 Assesses baseline skin condition A280.1 Identifies baseline musculoskeletal status Im.50 Implements           protective measures to prevent injury due to electrical sources  Im.60 Uses supplies and equipment within safe           parameters Im.80 Applies safety devices                              Entry 1  ESU Type                  GENERATOR                       Identification                  [id number]                              [employer]              Number     Coag Setting (watts)      35                              Cut Setting (watts)             35    Grounding Pad             Yes                             Grounding Pad Site              Thigh, right    Needed?     Grounding Pad             MIMS, RN, [facility]        ESU Comment                     [id number]    Applied By     Outcome Met (O.10)        Yes     Last Modified By:         MIMS, RN, [facility]                              [facility] 05/18/19 19:00:01    Post-Care Text:            E.10 Evaluates for signs and symptoms of physical injury to skin and tissue O.10 Patient is free from signs and           symptoms of injury related to thermal sources  O.70 Patient is free from signs and symptoms of electrical injury      RH[facility] - Patient Care Devices                                                                     Pre-Care Text:            A.200 Assesses risk for normothermia regulation A.40 Verifies presence of prosthetics or corrective devices           Im.280 Implements thermoregulation measures Im.60 Uses supplies and equipment within safe parameters                              Entry 1  Equipment Type            MACHINE SEQUENTIAL              SCD Sleeve Site                 Legs Bilateral                              COMPRESSION    Initiated Pre             Yes    Induction     Last Modified By:         MIMS, RN, [facility]                              [facility] 05/18/19 18:42:47    Post-Care Text:            E.10 Evaluates signs and symptoms of physical injury to skin and tissue O.60 Patient is free from signs and           symptoms of injury caused by extraneous objects      RHOR - Medications                                                                              Pre-Care Text:            A.10 Confirms patient identity A.30 Verifies allergies Im.220 Administers prescribed medications Im.220.2           Administers prescribed antibiotic therapy as ordered                              Entry 1                                                                                                          Time Administered         05/18/19 19:27:00               Medication                      BUPIVICAINE - MPF 0.5%  10ML VIAL    Route of Admin            Local Injection                 Dose/Volume                     30ML                                                              (include amount and                                                               unit of measure)     Site                      Abdomen                         Administered By                 Juanita Laster D-MD    Outcome Met (O.130)       Yes    Last Modified By:         MIMS, RN, [facility]                              [facility] 05/18/19 19:28:49    Post-Care Text:            E.20 Evaluates response to medications O.130 Patient receives appropriately administerd medication(s)      RHOR - Medications Audit                                                                         05/18/19 19:28:49         Owner: MIMSDE                               Modifier: MIMSDE                                                            1     <*> Medication                             BUPIVICAINE - MPF 0.5% 10ML VIAL            1     <*> Dose/Volume (include amount and  10ML                      unit of measure)        RHOR - Specimens                                                                                                          Entry 1                                                                                                          Description               APPENDIX                        Specimen Type                   Routine    Last Modified By:         MIMS, RN, [place] 05/18/19 19:05:31      RHOR - Communication                                                                            Pre-Care Text:            A.520 Identifies barriers to communication (Patient and Family Communications) A.20 Verifies operative           procedure, surgical site, and laterality Education officer, museum) Im.500 Provides status reports to family           members Im.150 Develops  individualized plan of care                              Entry 1  Communication             Phone Call                      Communication By                MIMS, RN, Nickolas Madrid    Date and Time             05/18/19 19:12:00               Communication To                MOM    Last Modified By:         MIMS, RN, [facility]                              [facility] 05/18/19 19:12:36    Post-Care Text:            E.520 Evaluates psychosocial response to plan of care O.500 Patient or designated support person demonstrates           knowledge of the expected psychosocial responses to the procedure E.800 Ensures continuity of care O.50           Patient's current status is communicated throughout the continuum of care      RHOR - Dressing/Packing                                                                         Pre-Care Text:            A.350 Assesses susceptibility for infection Im.250 Administers care to invasive devices Im.290 Administer care           to wound sites  Im.300 Implements aseptic technique                              Entry 1                                                                                                          Site                      Abdomen    Dressing Item     Details      Dressing Item            Liquid Bandage     (Im.290)     Last Modified By:         MIMS, RN, [facility]  [facility] 05/18/19 19:03:14    Post-Care Text:            E.320 Evaluate factors associted with increased risk for postoperative infection at the completion of the           procedure O.200 Patient's wound perfusion is consistent with or improved from baseline levels  O.Patient is           free from signs and symptoms of infection      RHOR - Procedures                                                                               Pre-Care Text:            A.20 Verifies operative  procedure, surgical site, and laterality Im.150 Develops individualized plan of care                              Entry 1                                                                                                          Procedure     Description      Procedure                Appendectomy                    Surgical Procedure              LAP APPY                              Laparoscopic                    Text     Primary Procedure         Yes                             Primary Surgeon                 Juanita Laster D-MD    Start                     05/18/19 19:03:00               Stop                            05/18/19 19:34:00    Anesthesia Type           General  Surgical Service                General (SN)    Wound Class               2-Clean-Contaminated    Last Modified By:         MIMS, RN, [facility]                              [facility] 05/18/19 19:41:09    Post-Care Text:            O.730 The patinet's care is consistent with the individualized perioperative plan of care      RHOR - Procedures Audit                                                                          05/18/19 19:41:09         Owner: MIMSDE                               Modifier: MIMSDE                                                        <+> 1         Stop        RHOR - Transfer                                                                                                           Entry 1                                                                                                          Transferred By            Neale Burly,         Via                             Stretcher  MIMS, RN, [facility]    Post-op Destination       Other/See Comments    Skin Assessment      Condition                Intact    Last Modified By:         MIMS, RN, [facility]                              [facility] 05/18/19 19:06:09    General Comments:            SEE PACU NOTES, PATIENT  RECOVERED IN OR9      Case Comments                                                                                         <None>              Finalized By: Elza Rafter      Document Signatures                                                                             Signed By:           MIMS, RN, [facility] 05/18/19 19:41          Faw, RN, Lorie Apley 05/20/19 11:20      Unfinalized History                                                                                     Date/Time            Username    Reason for Unfinalizing         Freetext Reason for Unfinalizing                                          05/20/19 11:20       Greasy Documentation

## 2019-05-20 NOTE — Case Communication (Signed)
 CM Discharge Planning Assessment - Text       CM Progress Note Entered On:  05/20/2019 12:23 EDT    Performed On:  05/20/2019 12:22 EDT by TILMON, RN, AMANDA J               CM Progress Note   CM Home/Lay Caregiver Name/Relationship :   Blinda Father  # [phone]   CM Initial Tentative Discharge Plan :     D/C back to [facility]   CM Alternate Discharge Plan :     TBD   CM Progress Note :   05/18/2019  Pt is + ve for COVID.  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  He is independant with ADL's and Mobility. He states he doesn't have a PCP as he  is never ill.  Pt lives in [place] when not at school.  Dr Lenord is admitting this patient to  [address] in an OBSERVATION Status for an Acute appendicitis.  Ione Sack LMSW    05/19/2019 Pt now pod #1 for lap appy. 4/14 bld cx pending. Pt afebrile, requiring 2L O2NC. CM to follow, no dc needs anticipated att. ajd    05/20/2019 POD #2 lap appy. Afebrile, on room air overnight into today. Bld cx from 4/14 ntd. Per MD note, can be dc home from covid standpoint. No needs anticipated. ariel TILMON, RN, AMANDA J - 05/20/2019 12:22 EDT

## 2019-05-20 NOTE — Nursing Note (Signed)
Nursing Discharge Summary - Text       Physician Discharge Summary Entered On:  05/20/2019 10:09 EDT    Performed On:  05/20/2019 10:08 EDT by Carmela Rima,  Dany Walther MORIN-PA               DC Information   Provider Instructions for Diet :   A Healthy Diet   Provider Instructions for Activity :   Daily walk, Other: No lifting over 20 lbs x 2-3 weeks   Provider Instructions for Wound Care :   Keep incision clean and dry, May shower, No tub bath   Mylynn Dinh,  Willma Obando MORIN-PA - 05/20/2019 10:08 EDT

## 2019-05-20 NOTE — Discharge Summary (Signed)
Inpatient Patient Summary                 [facility]  [address] [place] [address]  [phone]  Patient Discharge Instructions     Name: Drew Lowe, Drew Lowe [facility]  Current Date: 05/20/2019 13:10:59  DOB: 1995-07-14 MRN: [id number] FIN: [id number]  Patient Address: [place] [address]  Patient Phone: [phone]  Primary Care Provider:  Name: PCP,  NONE  Phone:   Immunizations Provided:      Discharge Diagnosis: Acute appendicitis; COVID-19  Discharged To: TO, ANTICIPATED%>  Home Treatments: TREATMENTS, ANTICIPATED%>  Devices/Equipment: EQUIPMENT REHAB%>  Post Hospital Services: HOSPITAL SERVICES%>  Professional Skilled Services: SKILLED SERVICES%>  Education administrator and Community Resources:                SERV AND COMM RES, ANTICIPATED%>  Mode of Discharge Transportation: TRANSPORTATION%>  Discharge Orders          Special Instructions No heavy lifting over 25 pounds. Otherwise any activity that doesn't hurt is OK.  Special Instructions You may shower tomorrow, dermabond is waterproof and will peel off in 1-2 weeks.  No tub bathing for 2 weeks.  Special Instructions Follow up with Dr. Mercie Eon in 2 weeks, call the office for appointment [phone]  Special Instructions Resume a healthy diet  Special Instructions Take 1000mg  Tylenol every 8 hours for next 2 days.  Can also take ibuprofen (Advil) 400-600mg  every 6 hours.  Oxycodone for breakthrough pain         Comment:      Medications  During the course of your visit, your medication list was updated with the most current information. The details of those changes are reflected below:          New Medications  Other Medications  senna (senna 8.6 mg oral tablet) 2 Tabs Oral (given by mouth) 2 times a day as needed constipation.  Last Dose:____________________  Medications That Have Not Changed  Printed Prescriptions  oxyCODONE (oxyCODONE 5 mg oral tablet) 1 Tabs Oral (given by mouth) every 6 hours as needed severe pain (8-10). Refills:  0.  Last Dose:____________________         [facility] would like to thank you for allowing [place] to assist you with your healthcare needs. The following includes patient education materials and information regarding your injury/illness.     Nelon, Manuelito has been given the following list of follow-up instructions, prescriptions, and patient education materials:  Follow-up Instructions:              With: Address: When:   [address] [place] [address]  [phone] In 2 weeks   Comments:   Follow up in 2 weeks with Dr. Mercie Eon or [facility]. Call the [facility] Surgical Oncology Office at [phone] to schedule appointment.       With: Address: When:   NONE PCP                     It is important to always keep an active list of medications available so that you can share with other providers and manage your medications appropriately. As an additional courtesy, we are also providing you with your final active medications list that you can keep with you.           oxyCODONE (oxyCODONE 5 mg oral tablet) 1 Tabs Oral (given by mouth) every 6 hours as needed severe pain (8-10). Refills:  0.  senna (senna 8.6 mg oral tablet) 2 Tabs Oral (given by mouth) 2 times a day as needed constipation.      Take only the medications listed above. Contact your doctor prior to taking any medications not on this list.        Discharge instructions, if any, will display below     Instructions for Diet: INSTRUCTIONS FOR DIET%>A Healthy Diet  Instructions for Supplements: SUPPLEMENT INSTRUCTIONS%>  Instructions for Activity: INSTRUCTIONS FOR ACTIVITY%>Daily walk, Other: No lifting over 20 lbs x 2-3 weeks  Instructions for Wound Care: INSTRUCTIONS FOR WOUND CARE%>Keep incision clean and dry, May shower, No tub bath     Medication leaflets, if any, will display below    Patient education materials, if any, will display below          IS IT A STROKE?  Act FAST and Check for these signs:    FACE                           Does the face look uneven?    ARM                          Does one arm drift down?    SPEECH                     Does their speech sound strange?    TIME                          Call 9-1-1 at any sign of stroke  Heart Attack Signs  Chest discomfort: Most heart attacks involve discomfort in the center of the chest and lasts more than a few minutes, or goes away and comes back. It can feel like uncomfortable pressure, squeezing, fullness or pain.  Discomfort in upper body: Symptoms can include pain or discomfort in one or both arms, back, neck, jaw or stomach.  Shortness of breath: With or without discomfort.  Other signs: Breaking out in a cold sweat, nausea, or lightheaded.  Remember, MINUTES DO MATTER. If you experience any of these heart attack warning signs, call 9-1-1 to get immediate medical attention!     ---------------------------------------------------------------------------------------------------------------------  [facility] allows you to manage your health, view your test results, and retrieve your discharge documents from your hospital stay securely and conveniently from your computer.  To begin the enrollment process, visit [email or web address] Click on ???Sign up now??? under [place] [facility].

## 2019-05-20 NOTE — Discharge Summary (Signed)
Inpatient Clinical Summary             [facility]  Post-Acute Care Transfer Instructions  PERSON INFORMATION   Name: Drew Lowe, Drew Lowe   MRN: [id number]    FIN#: [id number]   PHYSICIANS  Admitting Physician: Ramond Dial D-MD  Attending Physician: Ramond Dial D-MD   PCP: PCP,  NONE  Discharge Diagnosis: Acute appendicitis; COVID-19  Comment:       PATIENT EDUCATION INFORMATION  Instructions:               Medication Leaflets:               Follow-up:                           With: Address: When:   Zonie Crutcher [address] [place], [place] [address]  [phone] In 2 weeks   Comments:   Follow up in 2 weeks with Dr. Renie Ora or [facility] [place]. Call the [facility] Surgical Oncology Office at [phone] to schedule appointment.       With: Address: When:   NONE PCP                               MEDICATION LIST  Medication Reconciliation at Discharge:          New Medications  Other Medications  senna (senna 8.6 mg oral tablet) 2 Tabs Oral (given by mouth) 2 times a day as needed constipation.  Last Dose:____________________  Medications That Have Not Changed  Printed Prescriptions  oxyCODONE (oxyCODONE 5 mg oral tablet) 1 Tabs Oral (given by mouth) every 6 hours as needed severe pain (8-10). Refills: 0.  Last Dose:____________________         Patient???s Final Home Medication List Upon Discharge:           oxyCODONE (oxyCODONE 5 mg oral tablet) 1 Tabs Oral (given by mouth) every 6 hours as needed severe pain (8-10). Refills: 0.  senna (senna 8.6 mg oral tablet) 2 Tabs Oral (given by mouth) 2 times a day as needed constipation.         Comment:       ORDERS          Order Name Order Details   Special Instructions No heavy lifting over 25 pounds. Otherwise any activity that doesn't hurt is OK.   Special Instructions You may shower tomorrow, dermabond is waterproof and will peel off in 1-2 weeks. No tub bathing for 2 weeks.   Special Instructions Take 1000mg  Tylenol every 8 hours for next 2 days.  Can also take ibuprofen (Advil) 400-600mg  every 6 hours. Oxycodone for breakthrough pain   Special Instructions Resume a healthy diet   Special Instructions Follow up with Dr. in 2 weeks, call the office for appointment [phone]

## 2019-05-23 LAB — CULTURE, BLOOD 1

## 2020-06-30 ENCOUNTER — Other Ambulatory Visit: Payer: Self-pay

## 2020-06-30 ENCOUNTER — Ambulatory Visit (HOSPITAL_COMMUNITY)
Admission: RE | Admit: 2020-06-30 | Discharge: 2020-06-30 | Disposition: A | Discharge status: Home / Self Care | Payer: 59 | Source: Ambulatory Visit | Attending: Emergency Medicine | Admitting: Emergency Medicine

## 2020-06-30 ENCOUNTER — Encounter (HOSPITAL_COMMUNITY): Payer: Self-pay

## 2020-06-30 VITALS — BP 143/91 | HR 87 | Temp 98.4°F | Resp 18

## 2020-06-30 DIAGNOSIS — J069 Acute upper respiratory infection, unspecified: Secondary | ICD-10-CM | POA: Diagnosis not present

## 2020-06-30 MED ORDER — BENZONATATE 100 MG PO CAPS: 200.0000 mg | ORAL_CAPSULE | Freq: Three times a day (TID) | ORAL | 0 refills | Status: AC | PRN

## 2020-06-30 MED ORDER — PROMETHAZINE-DM 6.25-15 MG/5ML PO SYRP: 5.0000 mL | ORAL_SOLUTION | Freq: Four times a day (QID) | ORAL | 0 refills | Status: AC | PRN

## 2020-06-30 NOTE — ED Provider Notes (Signed)
[facility]    CSN: [id number] Arrival date & time: 06/30/20  1442      History   Chief Complaint Chief Complaint  Patient presents with  . Fever  . Nasal Congestion  . Cough    HPI Russell Larson is a 25 y.o. male.   Patient here for evaluation of fever, cough and congestion that has been ongoing for the past 3 days.  Reports having fever of 105 last night and took Tylenol.  Temperature 98.4 in office.  Reports taking Nyquil/Dayquil with symptom relief.  Reports cough causing burning in chest.  Denies any known sick contacts.  Reports taking a COVID test recently that was negative.  Denies any trauma, injury, or other precipitating event.  Denies any specific alleviating or aggravating factors.  Denies any fevers, chest pain, shortness of breath, N/V/D, numbness, tingling, weakness, abdominal pain, or headaches.    The history is provided by the patient.  Fever Associated symptoms: congestion and cough   Associated symptoms: no sore throat   Cough Associated symptoms: fever   Associated symptoms: no sore throat     History reviewed. No pertinent past medical history.  There are no problems to display for this patient.   History reviewed. No pertinent surgical history.     Home Medications    Prior to Admission medications   Medication Sig Start Date End Date Taking? Authorizing Provider  benzonatate (TESSALON PERLES) 100 MG capsule Take 2 capsules (200 mg total) by mouth 3 (three) times daily as needed for cough. 06/30/20  Yes Ivette Loyal, NP  promethazine-dextromethorphan (PROMETHAZINE-DM) 6.25-15 MG/5ML syrup Take 5 mLs by mouth 4 (four) times daily as needed for cough. 06/30/20  Yes Ivette Loyal, NP    Family History History reviewed. No pertinent family history.  Social History     Allergies   Patient has no known allergies.   Review of Systems Review of Systems  Constitutional: Positive for fever.  HENT: Positive for congestion.  Negative for sore throat.   Respiratory: Positive for cough.   All other systems reviewed and are negative.    Physical Exam Triage Vital Signs ED Triage Vitals  Enc Vitals Group     BP 06/30/20 1521 (!) 143/91     Pulse Rate 06/30/20 1521 87     Resp 06/30/20 1521 18     Temp 06/30/20 1521 98.4 F (36.9 C)     Temp Source 06/30/20 1521 Oral     SpO2 06/30/20 1521 100 %     Weight --      Height --      Head Circumference --      Peak Flow --      Pain Score 06/30/20 1522 0     Pain Loc --      Pain Edu? --      Excl. in GC? --    No data found.  Updated Vital Signs BP (!) 143/91 (BP Location: Right Arm)   Pulse 87   Temp 98.4 F (36.9 C) (Oral)   Resp 18   SpO2 100%   Visual Acuity Right Eye Distance:   Left Eye Distance:   Bilateral Distance:    Right Eye Near:   Left Eye Near:    Bilateral Near:     Physical Exam Vitals and nursing note reviewed.  Constitutional:      General: He is not in acute distress.    Appearance: Normal appearance. He is not ill-appearing, toxic-appearing  or diaphoretic.  HENT:     Head: Normocephalic and atraumatic.     Right Ear: Tympanic membrane, ear canal and external ear normal.     Left Ear: Tympanic membrane, ear canal and external ear normal.     Nose: Rhinorrhea present. Rhinorrhea is clear.     Right Turbinates: Swollen.     Left Turbinates: Swollen.     Right Sinus: No maxillary sinus tenderness or frontal sinus tenderness.     Left Sinus: No maxillary sinus tenderness or frontal sinus tenderness.     Mouth/Throat:     Mouth: Mucous membranes are moist.     Pharynx: Oropharynx is clear. Uvula midline.     Tonsils: No tonsillar exudate or tonsillar abscesses. 0 on the right. 0 on the left.  Eyes:     Conjunctiva/sclera: Conjunctivae normal.  Cardiovascular:     Rate and Rhythm: Normal rate and regular rhythm.     Pulses: Normal pulses.     Heart sounds: Normal heart sounds.  Pulmonary:     Effort: Pulmonary  effort is normal. No respiratory distress.     Breath sounds: Normal breath sounds. No stridor. No wheezing, rhonchi or rales.  Chest:     Chest wall: No tenderness.  Abdominal:     General: Abdomen is flat.  Musculoskeletal:        General: Normal range of motion.     Cervical back: Normal range of motion.  Skin:    General: Skin is warm and dry.  Neurological:     General: No focal deficit present.     Mental Status: He is alert and oriented to person, place, and time.  Psychiatric:        Mood and Affect: Mood normal.      UC Treatments / Results  Labs (all labs ordered are listed, but only abnormal results are displayed) Labs Reviewed - No data to display  EKG   Radiology No results found.  Procedures Procedures (including critical care time)  Medications Ordered in UC Medications - No data to display  Initial Impression / Assessment and Plan / UC Course  I have reviewed the triage vital signs and the nursing notes.  Pertinent labs & imaging results that were available during my care of the patient were reviewed by me and considered in my medical decision making (see chart for details).    Assessment negative for red flags or concerns.  Likely viral URI with cough.  Tessalon perles and Promethazine DM prescribed as needed for cough.  May take Tylenol and/or Ibuprofen for pain and fever.  Discussed conservative symptom management as described in discharge instructions.  Encouraged fluids and rest.  Follow up with primary care as needed.   Final Clinical Impressions(s) / UC Diagnoses   Final diagnoses:  Viral URI with cough     Discharge Instructions     You can take Tylenol and/or Ibuprofen as needed for fever reduction and pain relief.   You can take the Tessalon perles as needed for cough.  You can also take the Promethazine DM as needed for cough, but it can make you sleepy so don't take it prior to working or driving.    For cough: honey 1/2 to 1  teaspoon (you can dilute the honey in water or another fluid).  You can also use guaifenesin and dextromethorphan for cough. You can use a humidifier for chest congestion and cough.  If you don't have a humidifier, you can sit in  the bathroom with the hot shower running.     For sore throat: try warm salt water gargles, cepacol lozenges, throat spray, warm tea or water with lemon/honey, popsicles or ice, or OTC cold relief medicine for throat discomfort.    For congestion: take a daily anti-histamine like Zyrtec, Claritin, and a oral decongestant, such as pseudoephedrine.  You can also use Flonase 1-2 sprays in each nostril daily.    It is important to stay hydrated: drink plenty of fluids (water, gatorade/powerade/pedialyte, juices, or teas) to keep your throat moisturized and help further relieve irritation/discomfort.   Return or go to the Emergency Department if symptoms worsen or do not improve in the next few days.     ED Prescriptions    Medication Sig Dispense Auth. Provider   benzonatate (TESSALON PERLES) 100 MG capsule Take 2 capsules (200 mg total) by mouth 3 (three) times daily as needed for cough. 20 capsule Ivette Loyal, NP   promethazine-dextromethorphan (PROMETHAZINE-DM) 6.25-15 MG/5ML syrup Take 5 mLs by mouth 4 (four) times daily as needed for cough. 118 mL Ivette Loyal, NP     PDMP not reviewed this encounter.   Ivette Loyal, NP 06/30/20 [phone]

## 2020-06-30 NOTE — ED Triage Notes (Signed)
Pt present fever, cough and congestion . Symptom started 3 days ago. Pt took otc medication with no relief.

## 2020-06-30 NOTE — Discharge Instructions (Addendum)
You can take Tylenol and/or Ibuprofen as needed for fever reduction and pain relief.   You can take the Tessalon perles as needed for cough.  You can also take the Promethazine DM as needed for cough, but it can make you sleepy so don't take it prior to working or driving.    For cough: honey 1/2 to 1 teaspoon (you can dilute the honey in water or another fluid).  You can also use guaifenesin and dextromethorphan for cough. You can use a humidifier for chest congestion and cough.  If you don't have a humidifier, you can sit in the bathroom with the hot shower running.     For sore throat: try warm salt water gargles, cepacol lozenges, throat spray, warm tea or water with lemon/honey, popsicles or ice, or OTC cold relief medicine for throat discomfort.    For congestion: take a daily anti-histamine like Zyrtec, Claritin, and a oral decongestant, such as pseudoephedrine.  You can also use Flonase 1-2 sprays in each nostril daily.    It is important to stay hydrated: drink plenty of fluids (water, gatorade/powerade/pedialyte, juices, or teas) to keep your throat moisturized and help further relieve irritation/discomfort.   Return or go to the Emergency Department if symptoms worsen or do not improve in the next few days.
